# Patient Record
Sex: Female | Born: 1965 | Race: White | Hispanic: No | Marital: Married | Smoking: Never smoker
Health system: Southern US, Community
[De-identification: ages and names within clinical notes are randomized; demographics above are authoritative.]

## PROBLEM LIST (undated history)

## (undated) DIAGNOSIS — M069 Rheumatoid arthritis, unspecified: Secondary | ICD-10-CM

---

## 2005-05-15 ENCOUNTER — Other Ambulatory Visit: Admission: RE | Admit: 2005-05-15 | Discharge: 2005-05-15 | Payer: Self-pay | Admitting: Family Medicine

## 2007-02-14 ENCOUNTER — Ambulatory Visit (HOSPITAL_COMMUNITY): Admission: RE | Admit: 2007-02-14 | Discharge: 2007-02-14 | Payer: Self-pay | Admitting: Obstetrics and Gynecology

## 2007-03-13 ENCOUNTER — Inpatient Hospital Stay (HOSPITAL_COMMUNITY): Admission: AD | Admit: 2007-03-13 | Discharge: 2007-03-13 | Payer: Self-pay | Admitting: Obstetrics and Gynecology

## 2007-03-16 ENCOUNTER — Ambulatory Visit (HOSPITAL_COMMUNITY): Admission: RE | Admit: 2007-03-16 | Discharge: 2007-03-16 | Payer: Self-pay | Admitting: Obstetrics and Gynecology

## 2007-04-15 ENCOUNTER — Ambulatory Visit (HOSPITAL_COMMUNITY): Admission: RE | Admit: 2007-04-15 | Discharge: 2007-04-15 | Payer: Self-pay | Admitting: Obstetrics and Gynecology

## 2007-06-25 ENCOUNTER — Inpatient Hospital Stay (HOSPITAL_COMMUNITY): Admission: AD | Admit: 2007-06-25 | Discharge: 2007-06-27 | Payer: Self-pay | Admitting: Obstetrics and Gynecology

## 2007-06-25 ENCOUNTER — Encounter (INDEPENDENT_AMBULATORY_CARE_PROVIDER_SITE_OTHER): Payer: Self-pay | Admitting: Obstetrics and Gynecology

## 2009-10-27 IMAGING — CR DG ANKLE COMPLETE 3+V*R*
3 series · 3 of 3 positions shown · non-contrast
Comparison: none

CLINICAL DATA: Fall with lateral ankle pain. Patient is 6 months pregnant, and
the abdomen was shielded during today's radiographs.

RIGHT ANKLE - 3 VIEW

[view not recorded (1 of 3)]
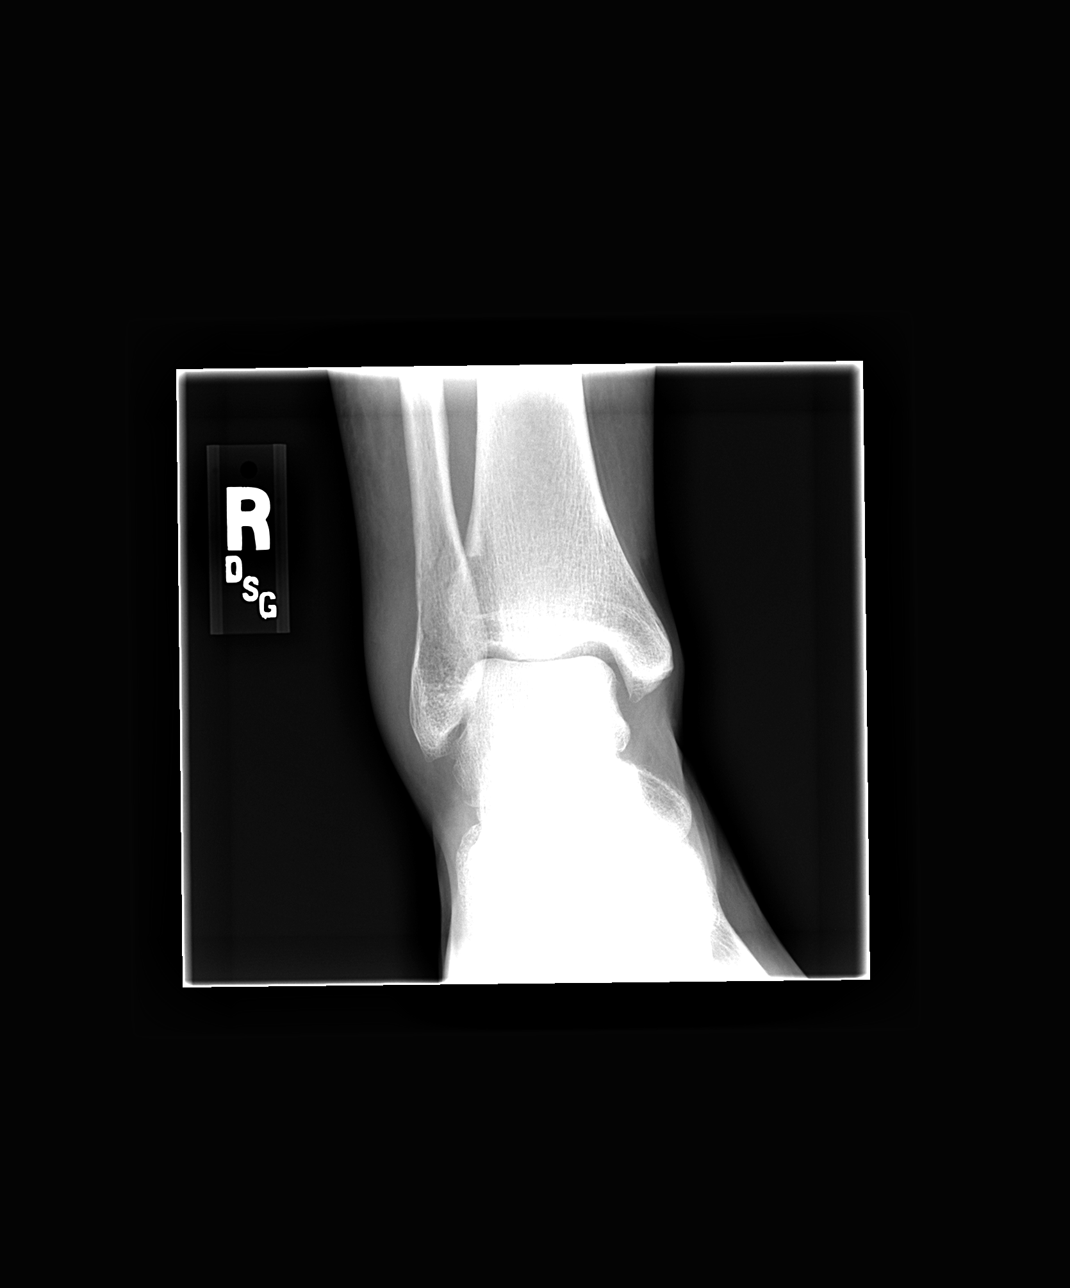

[view not recorded (2 of 3)]
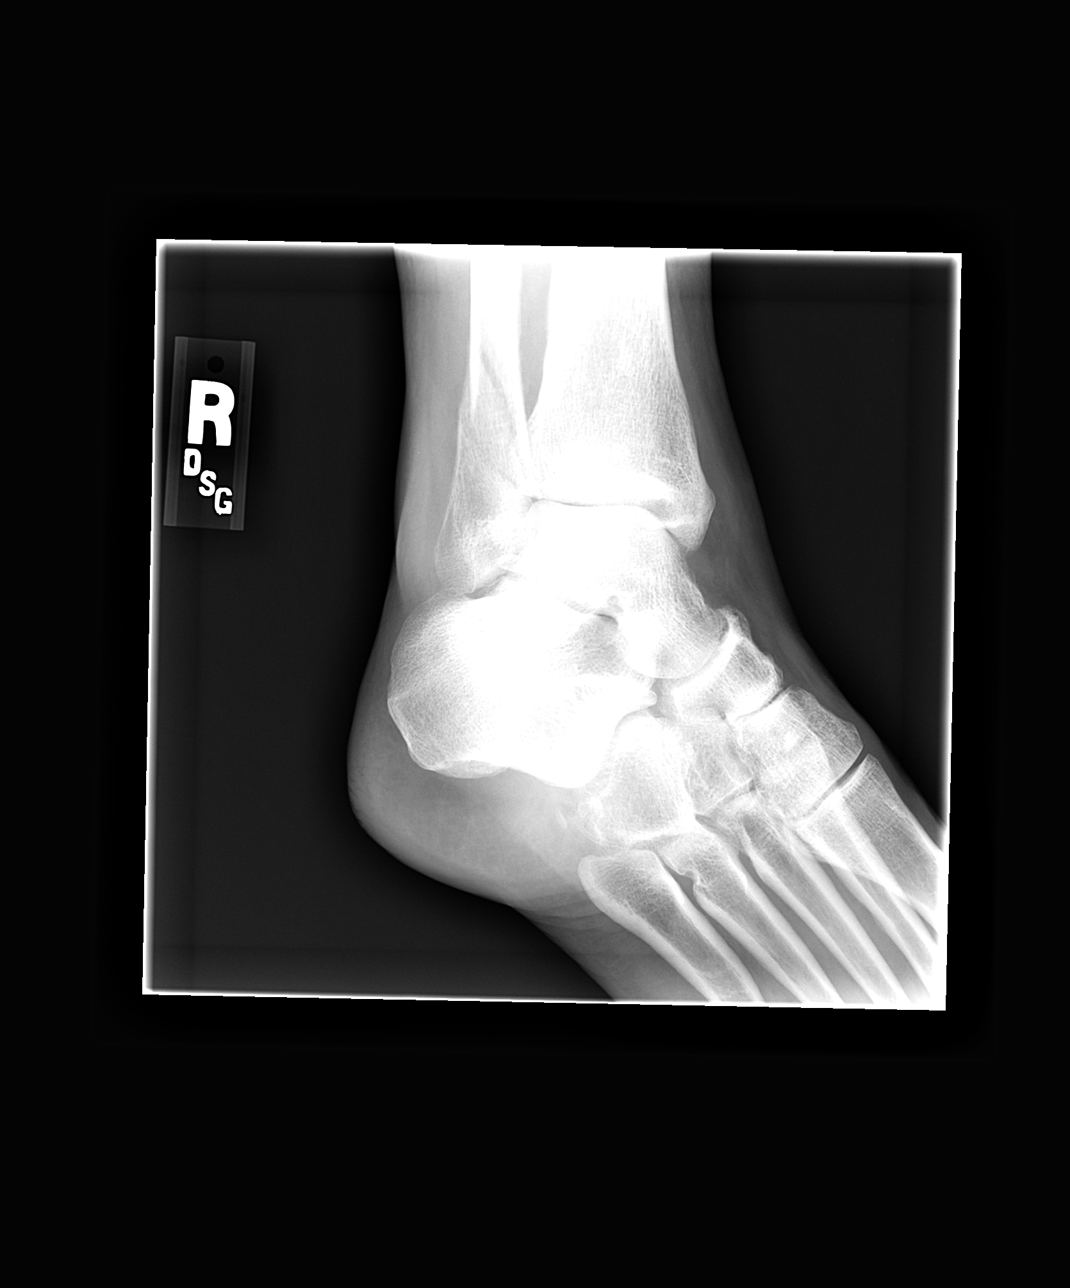

[view not recorded (3 of 3)]
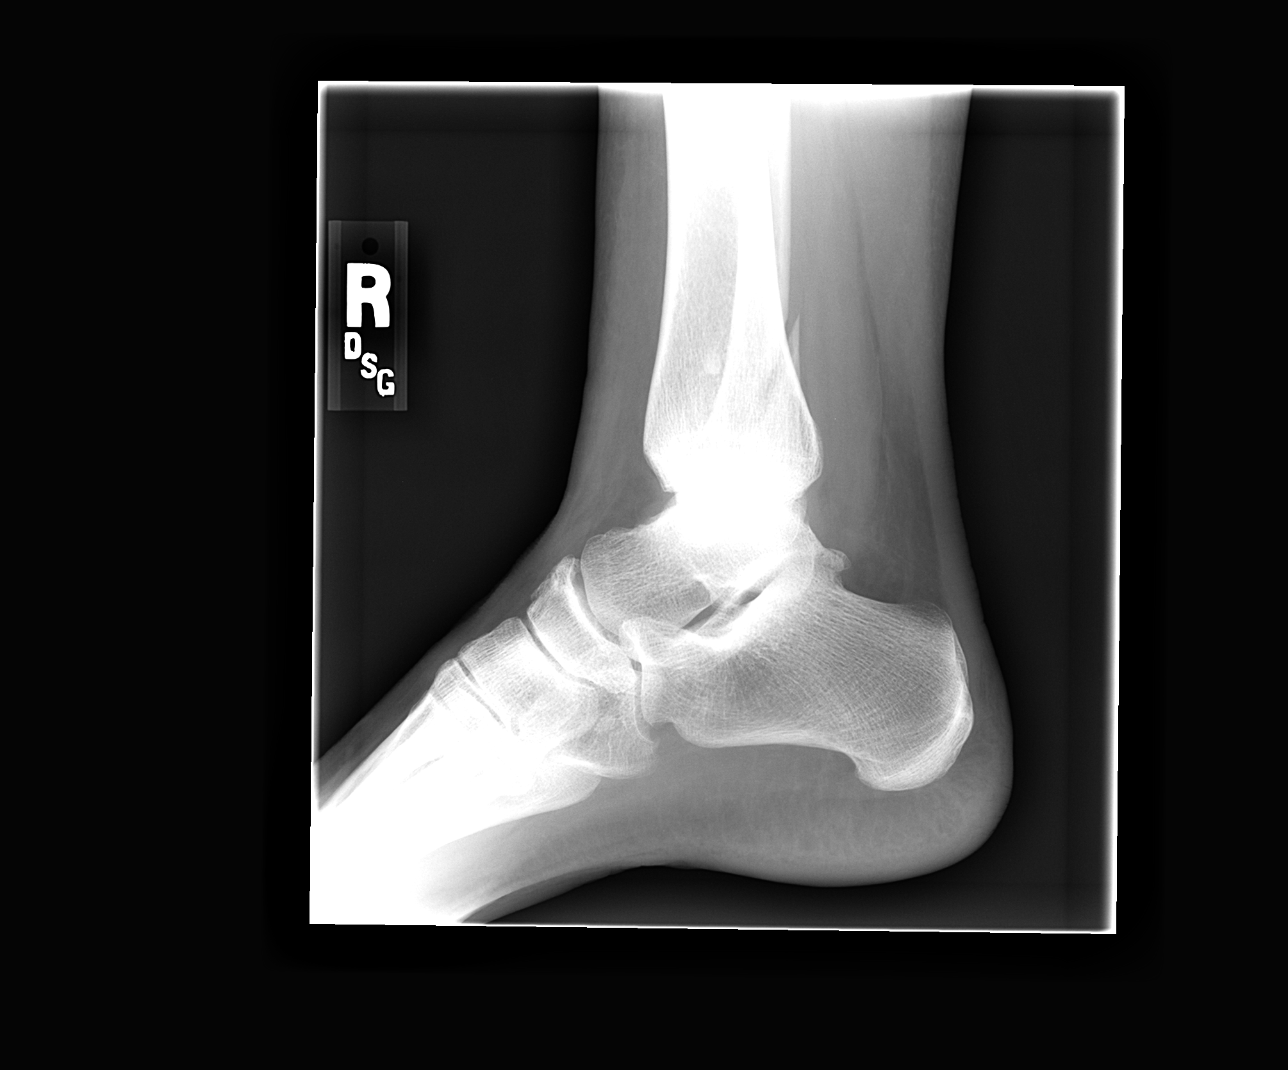

[3 of 3 positions shown; findings below may reference images not displayed]

FINDINGS: Oblique Weber B type fracture of the lateral malleolus is identified.
No medial or posterior malleolar fracture noted. The mortise space is preserved.

An os peroneus is noted. The calcaneus appears intact. An os trigonum is
present.

IMPRESSION

1. Oblique fracture of the lateral malleolus, without widening of the mortise.

## 2010-07-22 NOTE — Discharge Summary (Signed)
Maria Watson, Maria Watson           ACCOUNT NO.:  [id number]   MEDICAL RECORD NO.:  [id number]          PATIENT TYPE:  INP   LOCATION:  9107                          FACILITY:  [facility]   PHYSICIAN:  Malachi Pro. Ambrose Mantle, M.D. DATE OF BIRTH:  02/26/1966   DATE OF ADMISSION:  06/25/2007  DATE OF DISCHARGE:  06/27/2007                               DISCHARGE SUMMARY   A 45 year old white married female, para 1-0-0-1, gravida 2, EDC of  July 02, 2007, admitted for induction of labor.  Blood group and type,  A positive, negative antibody, nonreactive serology.  Rubella immune.  Hepatitis B surface antigen negative.  HIV negative.  GC and Chlamydia  negative.  First trimester screen negative.  The patient was offered  amnio, decline.  Group B strep negative.  The patient broke her ankle in  the second trimester at 24 weeks'.  Vaginal ultrasound on November 26, 2006, crown-rump length 2.25 cm 8 weeks 6 days, EDC on July 02, 2007.  The patient was seen by the [facility].  She  declined amnio.  Followup ultrasound showed normal AFI and normal  growth.   PAST MEDICAL HISTORY:  No known allergies, no operations, or no  illnesses.  The patient did use to backup prior to pregnancy.   FAMILY HISTORY:  Brother with diabetes.   OBSTETRIC HISTORY:  June 1988, the patient delivered a 6 pound 5-1/4  ounce female at 80 weeks' via forceps.   PHYSICAL EXAM ON ADMISSION:  VITAL SIGNS:  Normal.  HEART:  Normal.  LUNGS:  Normal.  ABDOMEN:  Soft, nontender.  Fundal height had been 38.5 cm.  On June 14, 2007, fetal heart tone returned to a normal.  Cervix was 2 cm, 60%,  vertex at -2.   ADMITTING IMPRESSIONS:  Intrauterine pregnancy at 63 weeks' at advanced  maternal age.  The patient was placed on Pitocin.  She reached 4 to 5 cm  and requested an epidural but she progressed rapidly to full diltation  and delivered spontaneously OA with second degree midline laceration of  a  living female infant 7 pounds and 10 ounces with Apgars of 8 at 1 and  9 at 5  minutes.  Placenta was intact.  The uterus was firm with the  walls never approximated.  Bleeding was not excessive, so I withheld  additional oxytocic agents.  Second degree midline laceration repaired  with 3-0 Vicryl.  Blood loss about 400 mL.  Postpartum, the patient did  quite well and was discharged on the second postpartum day.  Initial  hemoglobin was 13.4, hematocrit 38.0, white count 9300, platelet count  of 129, 000.  RPR was nonreactive.  Followup hemoglobin 13.4, hematocrit  38.1, platelet count 133, 000.   FINAL DIAGNOSES:  Intrauterine pregnancy at 39 weeks', delivered OA.  Operation, spontaneous delivery OA.  Repair of second degree midline  laceration.   FINAL CONDITION:  Improved.   INSTRUCTIONS:  Include our regular discharge instruction booklet.  The  patient declines analgesics at discharge, and she is advised to return  in 6 weeks to followup examination.  She  requests the Depo-Provera 150  mg IM prior to discharge.      Malachi Pro. Ambrose Mantle, M.D.  Electronically Signed     TFH/MEDQ  D:  06/27/2007  T:  06/28/2007  Job:  [id number]

## 2010-12-02 LAB — CBC
HCT: 38.1
Hemoglobin: 13.4
MCHC: 35
Platelets: 129 — ABNORMAL LOW
Platelets: 133 — ABNORMAL LOW
RBC: 3.94
RBC: 3.96
WBC: 12.3 — ABNORMAL HIGH
WBC: 9.3

## 2010-12-02 LAB — RPR: RPR Ser Ql: NONREACTIVE

## 2012-07-15 ENCOUNTER — Ambulatory Visit: Payer: BC Managed Care – PPO

## 2012-07-15 ENCOUNTER — Ambulatory Visit (INDEPENDENT_AMBULATORY_CARE_PROVIDER_SITE_OTHER): Payer: BC Managed Care – PPO | Admitting: Family Medicine

## 2012-07-15 VITALS — BP 124/82 | HR 96 | Temp 98.6°F | Resp 17 | Ht 67.0 in | Wt 214.0 lb

## 2012-07-15 DIAGNOSIS — IMO0002 Reserved for concepts with insufficient information to code with codable children: Secondary | ICD-10-CM

## 2012-07-15 DIAGNOSIS — M25511 Pain in right shoulder: Secondary | ICD-10-CM

## 2012-07-15 DIAGNOSIS — M25519 Pain in unspecified shoulder: Secondary | ICD-10-CM

## 2012-07-15 DIAGNOSIS — S46911A Strain of unspecified muscle, fascia and tendon at shoulder and upper arm level, right arm, initial encounter: Secondary | ICD-10-CM

## 2012-07-15 MED ORDER — HYDROCODONE-ACETAMINOPHEN 5-325 MG PO TABS: 1.0000 | ORAL_TABLET | Freq: Three times a day (TID) | ORAL | Status: DC | PRN

## 2012-07-15 MED ORDER — MELOXICAM 7.5 MG PO TABS: 7.5000 mg | ORAL_TABLET | Freq: Every day | ORAL | Status: DC

## 2012-07-15 NOTE — Progress Notes (Signed)
[facility] [address] [place] [address] [phone]  Date:  07/15/2012   Name:  Maria Watson   DOB:  11-09-1965   MRN:  [id number]  PCP:  No PCP Per Patient    Chief Complaint: Shoulder Pain   History of Present Illness:  Maria Watson is a 47 y.o. very pleasant female patient who presents with the following:  She is here with right shoulder pain since yesterday.  She is not aware of any particular injury.   She has tried OTC medications and heat but continues to have pain. She had not been aware of any twinges of pain, or of any other shoulder problems in the past several weeks.  She did "yank" her shoulder recently when she tried to open a door that was locked, but this did not seem like a big deal at the time.  She is having a hard time raising her right arm to dress, apply make-up, etc.    She has a 1 year old son and uses implanon for contraception.   She does not do any overhead activities, no new activities or weight lifting as of late.    She is otherwise generally healthy.    Her left shoulder is ok.  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   History reviewed. No pertinent past medical history.  History reviewed. No pertinent past surgical history.  History  Substance Use Topics  . Smoking status: Never Smoker   . Smokeless tobacco: Not on file  . Alcohol Use: No    History reviewed. No pertinent family history.  No Known Allergies  Medication list has been reviewed and updated.  No current outpatient prescriptions on file prior to visit.   No current facility-administered medications on file prior to visit.    Review of Systems:  As per HPI- otherwise negative.   Physical Examination: Filed Vitals:   07/15/12 1004  BP: 124/82  Pulse: 101  Temp: 99.4 F (37.4 C)  Resp: 17   Filed Vitals:   07/15/12 1004  Height: 5\' 7"  (1.702 m)   Weight: 214 lb (97.07 kg)   Body mass index is 33.51 kg/(m^2). Ideal Body Weight: Weight in (lb) to have BMI = 25: 159.3  GEN: WDWN, NAD, Non-toxic, A & O x 3, overweight HEENT: Atraumatic, Normocephalic. Neck supple. No masses, No LAD. Ears and Nose: No external deformity. CV: RRR, No M/G/R. No JVD. No thrill. No extra heart sounds. PULM: CTA B, no wheezes, crackles, rhonchi. No retractions. No resp. distress. No accessory muscle use. ABD: S, NT, ND, +BS. No rebound. No HSM. EXTR: No c/c/e NEURO Normal gait.  PSYCH: Normally interactive. Conversant. Not depressed or anxious appearing.  Calm demeanor.  Right shoulder: tender over the anterior right shoulder at the [facility] insertion.  She has about 120 degrees of passive flexion before pain limits flexion.  Able to abduct about 90 degrees.  Pain with internal and external rotation.  Grip, biceps and triceps strength testing normal.  Sensation is wnl bilaterlly, normal biceps DTR.   Neck ROM is normal, she is not tender over her trapezius or shoulder blade.    [facility] reading (PRIMARY) by  Dr. Patsy Lager. Right shoulder: no evidence of fracture, dislocation or ACJ separation   RIGHT SHOULDER - 2+ VIEW  Comparison: None.  Findings: Minimal acromioclavicular joint degenerative changes otherwise negative exam.  IMPRESSION: Minimal acromioclavicular joint degenerative changes otherwise negative exam.   Assessment and Plan: Right shoulder pain - Plan: DG Shoulder Right, meloxicam (MOBIC) 7.5 MG tablet, HYDROcodone-acetaminophen (NORCO/VICODIN) 5-325 MG per tablet  Acute right shoulder strain.  Treat with meloxicam and vicodin if needed for more severe pain. Close follow- up with referral to ortho if not better in the next few days.   Went over shoulder ROM exercises- arm swing and finger wall- climb   Signed Abbe Amsterdam, MD  Her orthopedist is Dr. Jodi Geralds at [facility] ortho

## 2012-07-15 NOTE — Patient Instructions (Addendum)
We are going to treat you for an acute right shoulder strain with rest (sling), ice, and anti- inflammatories (the mobic).  You may also use the hydrocodone if needed for more severe pain, but remember this can cause drowsiness so do not use it when you need to drive or use machinery.    If you are not a good bit better by Monday please give me a call. If you are not better I will arrange an appt for you with orthopedics to look at your shoulder further.    Even while you are using the sling, it is essential that we maintain your shoulder range of motion. Perform the exercises I showed you several times each day.

## 2019-06-21 ENCOUNTER — Ambulatory Visit
Admission: EM | Admit: 2019-06-21 | Discharge: 2019-06-21 | Disposition: A | Discharge status: Home / Self Care | Payer: 59

## 2019-06-21 ENCOUNTER — Other Ambulatory Visit: Payer: Self-pay

## 2019-06-21 DIAGNOSIS — M069 Rheumatoid arthritis, unspecified: Secondary | ICD-10-CM | POA: Diagnosis not present

## 2019-06-21 HISTORY — DX: Rheumatoid arthritis, unspecified: M06.9

## 2019-06-21 MED ORDER — ONDANSETRON 4 MG PO TBDP
4.0000 mg | ORAL_TABLET | Freq: Once | ORAL | Status: AC
  Administered 2019-06-21: 20:00:00 4 mg via ORAL

## 2019-06-21 MED ORDER — METHYLPREDNISOLONE SODIUM SUCC 125 MG IJ SOLR
125.0000 mg | Freq: Once | INTRAMUSCULAR | Status: AC
  Administered 2019-06-21: 20:00:00 125 mg via INTRAMUSCULAR

## 2019-06-21 MED ORDER — PREDNISONE 10 MG (21) PO TBPK: ORAL_TABLET | ORAL | 0 refills | Status: DC

## 2019-06-21 NOTE — ED Triage Notes (Signed)
Pain started at 0300 today in R hip radiating down leg to ankle.  Sharp, throbbing pain.  Feels like RA flare-up.  Her arthritis MD told her it was a flare-up and sent Rx for prednisone but pharmacy is out of stock and won't transfer.

## 2019-06-21 NOTE — Discharge Instructions (Signed)
Take medication as prescribed Follow-up with PCP for further evaluation Return or go to ED for worsening of symptoms 

## 2019-06-21 NOTE — ED Provider Notes (Signed)
[facility]    CSN: [id number] Arrival date & time: 06/21/19  1929      History   Chief Complaint Chief Complaint  Patient presents with  . Joint Pain    HPI Maria Watson is a 54 y.o. female.   With history of rheumatoid arthritis  presented to the urgent care with a complaint of joint pain that started today.  Denies any precipitating event.   PCP prescribed prednisone that was sent to [facility].  Currently [facility] is out of prednisone.  Localized pain to her right hip  and radiates to her right leg.  Described it as constant and achy and rated at 10 on a scale 1-10.  Has tried OTC medications without relief.  Her symptoms are made worse with ROM.  Reports similar symptoms in the past that resolved with prednisone treatment.  Denies chills, fever, nausea, vomiting, diarrhea, chest pain, chest tightness.  The history is provided by the patient. No language interpreter was used.    Past Medical History:  Diagnosis Date  . Rheumatoid arthritis (HCC)     There are no problems to display for this patient.   History reviewed. No pertinent surgical history.  OB History   No obstetric history on file.      Home Medications    Prior to Admission medications   Medication Sig Start Date End Date Taking? Authorizing Provider  meloxicam (MOBIC) 7.5 MG tablet Take 1 tablet (7.5 mg total) by mouth daily. May take 2 if needed 07/15/12  Yes Copland, Gwenlyn Found, MD  Tofacitinib Citrate (XELJANZ PO) Take by mouth.   Yes [provider]  HYDROcodone-acetaminophen (NORCO/VICODIN) 5-325 MG per tablet Take 1 tablet by mouth every 8 (eight) hours as needed for pain. 07/15/12   Copland, Gwenlyn Found, MD  predniSONE (STERAPRED UNI-PAK 21 TAB) 10 MG (21) TBPK tablet Take 6 tabs by mouth daily  for 2 days, then 5 tabs for 2 days, then 4 tabs for 2 days, then 3 tabs for 2 days, 2 tabs for 2 days, then 1 tab by mouth daily for 2 days 06/21/19   Durward Parcel, FNP     Family History Family History  Problem Relation Age of Onset  . Rheum arthritis Mother   . Stroke Father     Social History Social History   Tobacco Use  . Smoking status: Never Smoker  . Smokeless tobacco: Never Used  Substance Use Topics  . Alcohol use: No  . Drug use: No     Allergies   Patient has no known allergies.   Review of Systems Review of Systems  Constitutional: Negative.   Respiratory: Negative.   Cardiovascular: Negative.   Musculoskeletal: Positive for arthralgias and myalgias.  Neurological: Negative.   All other systems reviewed and are negative.    Physical Exam Triage Vital Signs ED Triage Vitals  Enc Vitals Group     BP      Pulse      Resp      Temp      Temp src      SpO2      Weight      Height      Head Circumference      Peak Flow      Pain Score      Pain Loc      Pain Edu?      Excl. in GC?    No data found.  Updated Vital Signs BP Marland Kitchen)  149/86   Pulse 95   Temp (!) 97.4 F (36.3 C) (Oral)   Resp (!) 22   SpO2 96%   Visual Acuity Right Eye Distance:   Left Eye Distance:   Bilateral Distance:    Right Eye Near:   Left Eye Near:    Bilateral Near:     Physical Exam Vitals and nursing note reviewed.  Constitutional:      General: She is not in acute distress.    Appearance: Normal appearance. She is normal weight. She is not ill-appearing, toxic-appearing or diaphoretic.  Cardiovascular:     Rate and Rhythm: Normal rate and regular rhythm.     Pulses: Normal pulses.     Heart sounds: Normal heart sounds. No murmur. No friction rub. No gallop.   Pulmonary:     Effort: Pulmonary effort is normal. Tachypnea present. No respiratory distress.     Breath sounds: Normal breath sounds. No stridor. No wheezing, rhonchi or rales.  Chest:     Chest wall: No tenderness.  Musculoskeletal:        General: Tenderness present. No swelling, deformity or signs of injury.     Right lower leg: No edema.     Left lower  leg: No edema.  Skin:    General: Skin is warm.     Coloration: Skin is not pale.     Findings: No bruising or rash.  Neurological:     General: No focal deficit present.     Mental Status: She is alert and oriented to person, place, and time.     Cranial Nerves: Cranial nerves are intact.     Sensory: No sensory deficit.     Motor: Motor function is intact.     Coordination: Coordination is intact.     Gait: Gait abnormal.      UC Treatments / Results  Labs (all labs ordered are listed, but only abnormal results are displayed) Labs Reviewed - No data to display  EKG   Radiology No results found.  Procedures Procedures (including critical care time)  Medications Ordered in UC Medications  methylPREDNISolone sodium succinate (SOLU-MEDROL) 125 mg/2 mL injection 125 mg (125 mg Intramuscular Given 06/21/19 1954)  ondansetron (ZOFRAN-ODT) disintegrating tablet 4 mg (4 mg Oral Given 06/21/19 2000)    Initial Impression / Assessment and Plan / UC Course  I have reviewed the triage vital signs and the nursing notes.  Pertinent labs & imaging results that were available during my care of the patient were reviewed by me and considered in my medical decision making (see chart for details).    Patient is stable at discharge.  Was made aware that steroid and tofacitinib combination may decrease the effect of her immune system.  Solu-Medrol IM was given in office.  Prednisone taper was prescribed.  Was advised to follow-up with PCP.  Return or go to ED for worsening of symptoms.  Final Clinical Impressions(s) / UC Diagnoses   Final diagnoses:  Rheumatoid arthritis involving right hip, unspecified whether rheumatoid factor present [facility])     Discharge Instructions     Take medication as prescribed Follow-up with PCP for further evaluation Return or go to ED for worsening of symptoms    ED Prescriptions    Medication Sig Dispense Auth. Provider   predniSONE (STERAPRED  UNI-PAK 21 TAB) 10 MG (21) TBPK tablet Take 6 tabs by mouth daily  for 2 days, then 5 tabs for 2 days, then 4 tabs for 2 days, then  3 tabs for 2 days, 2 tabs for 2 days, then 1 tab by mouth daily for 2 days 42 tablet Jesslyn Viglione, Darrelyn Hillock, FNP     I have reviewed the PDMP during this encounter.   Emerson Monte, FNP 06/22/19 [phone]

## 2019-08-28 ENCOUNTER — Encounter: Payer: Self-pay | Admitting: Emergency Medicine

## 2019-08-28 ENCOUNTER — Ambulatory Visit
Admission: EM | Admit: 2019-08-28 | Discharge: 2019-08-28 | Disposition: A | Discharge status: Home / Self Care | Payer: BC Managed Care – PPO | Attending: Emergency Medicine | Admitting: Emergency Medicine

## 2019-08-28 ENCOUNTER — Other Ambulatory Visit: Payer: Self-pay

## 2019-08-28 DIAGNOSIS — H66002 Acute suppurative otitis media without spontaneous rupture of ear drum, left ear: Secondary | ICD-10-CM | POA: Diagnosis not present

## 2019-08-28 DIAGNOSIS — R0981 Nasal congestion: Secondary | ICD-10-CM

## 2019-08-28 MED ORDER — AMOXICILLIN 500 MG PO CAPS: 500.0000 mg | ORAL_CAPSULE | Freq: Two times a day (BID) | ORAL | 0 refills | Status: AC

## 2019-08-28 NOTE — ED Provider Notes (Signed)
[facility]   [id number] 08/28/19 Arrival Time: 7741   CC: Ear pain  SUBJECTIVE: History from: patient.  Maria Watson is a 54 y.o. female who presents with sinus congestion, and mild cough x 1 week, and LT ear pain x 1 day.  Denies sick exposure to COVID, flu or strep.  Has tried OTC medications without relief.  Denies aggravating factors.  Reports previous symptoms in the past.   Denies fever, chills, fatigue,  SOB, wheezing, chest pain, nausea, vomiting, changes in bowel or bladder habits.    ROS: As per HPI.  All other pertinent ROS negative.     Past Medical History:  Diagnosis Date   Rheumatoid arthritis (HCC)    History reviewed. No pertinent surgical history. No Known Allergies No current facility-administered medications on file prior to encounter.   Current Outpatient Medications on File Prior to Encounter  Medication Sig Dispense Refill   Tofacitinib Citrate (XELJANZ PO) Take by mouth.     Social History   Socioeconomic History   Marital status: Married    Spouse name: Not on file   Number of children: Not on file   Years of education: Not on file   Highest education level: Not on file  Occupational History   Not on file  Tobacco Use   Smoking status: Never Smoker   Smokeless tobacco: Never Used  Vaping Use   Vaping Use: Never used  Substance and Sexual Activity   Alcohol use: No   Drug use: No   Sexual activity: Never    Birth control/protection: None  Other Topics Concern   Not on file  Social History Narrative   Not on file   Social Determinants of Health   Financial Resource Strain:    Difficulty of Paying Living Expenses:   Food Insecurity:    Worried About Programme researcher, broadcasting/film/video in the Last Year:    Barista in the Last Year:   Transportation Needs:    Freight forwarder (Medical):    Lack of Transportation (Non-Medical):   Physical Activity:    Days of Exercise per Week:    Minutes of  Exercise per Session:   Stress:    Feeling of Stress :   Social Connections:    Frequency of Communication with Friends and Family:    Frequency of Social Gatherings with Friends and Family:    Attends Religious Services:    Active Member of Clubs or Organizations:    Attends Engineer, structural:    Marital Status:   Intimate Partner Violence:    Fear of Current or Ex-Partner:    Emotionally Abused:    Physically Abused:    Sexually Abused:    Family History  Problem Relation Age of Onset   Rheum arthritis Mother    Stroke Father     OBJECTIVE:  Vitals:   08/28/19 0834  BP: 137/82  Pulse: 80  Resp: 17  Temp: 98.1 F (36.7 C)  TempSrc: Oral  SpO2: 96%     General appearance: alert; well-appearing, nontoxic; speaking in full sentences and tolerating own secretions HEENT: NCAT; Ears: RT EAC clear, RT TM pearly gray, LT EAC erythematous, and TM mildly erythematou; Eyes: PERRL.  EOM grossly intact.  Nose: nares patent without rhinorrhea, Throat: oropharynx clear, tonsils non erythematous or enlarged, uvula midline  Neck: supple without LAD Lungs: unlabored respirations, symmetrical air entry; cough: absent; no respiratory distress; CTAB Heart: regular rate and rhythm.   Skin:  warm and dry Psychological: alert and cooperative; normal mood and affect   ASSESSMENT & PLAN:  1. Non-recurrent acute suppurative otitis media of left ear without spontaneous rupture of tympanic membrane   2. Sinus congestion     Meds ordered this encounter  Medications   amoxicillin (AMOXIL) 500 MG capsule    Sig: Take 1 capsule (500 mg total) by mouth 2 (two) times daily for 10 days.    Dispense:  20 capsule    Refill:  0    Order Specific Question:   Supervising Provider    Answer:   Raylene Everts [[id number]]   Get plenty of rest and push fluids Use OTC zyrtec for nasal congestion, runny nose, and/or sore throat Use OTC flonase for nasal congestion and runny  nose Amoxicillin prescribed for LT ear infection Use OTC medications like ibuprofen or tylenol as needed fever or pain Return or go to the ED if you have any new or worsening symptoms such as fever, cough, shortness of breath, chest tightness, chest pain, turning blue, changes in mental status, redness, swelling, worsening ear pain, etc...   Reviewed expectations re: course of current medical issues. Questions answered. Outlined signs and symptoms indicating need for more acute intervention. Patient verbalized understanding. After Visit Summary given.         Stacey Drain [place], PA-C 08/28/19 [phone]

## 2019-08-28 NOTE — Discharge Instructions (Signed)
Get plenty of rest and push fluids Use OTC zyrtec for nasal congestion, runny nose, and/or sore throat Use OTC flonase for nasal congestion and runny nose Amoxicillin prescribed for LT ear infection Use OTC medications like ibuprofen or tylenol as needed fever or pain Return or go to the ED if you have any new or worsening symptoms such as fever, cough, shortness of breath, chest tightness, chest pain, turning blue, changes in mental status, redness, swelling, worsening ear pain, etc..Marland Kitchen

## 2019-08-28 NOTE — ED Triage Notes (Signed)
Nasal congestion x 1 week, lt ear pain started last night, tried otc meds and warm compresses with no relief.

## 2019-09-01 ENCOUNTER — Telehealth: Payer: Self-pay

## 2019-09-01 MED ORDER — NEOMYCIN-POLYMYXIN-HC 3.5-10000-1 OT SUSP: 4.0000 [drp] | Freq: Three times a day (TID) | OTIC | 0 refills | Status: AC

## 2020-03-07 ENCOUNTER — Ambulatory Visit
Admission: EM | Admit: 2020-03-07 | Discharge: 2020-03-07 | Disposition: A | Discharge status: Home / Self Care | Payer: BC Managed Care – PPO | Attending: Family Medicine | Admitting: Family Medicine

## 2020-03-07 ENCOUNTER — Other Ambulatory Visit: Payer: Self-pay

## 2020-03-07 DIAGNOSIS — R059 Cough, unspecified: Secondary | ICD-10-CM

## 2020-03-07 DIAGNOSIS — R0981 Nasal congestion: Secondary | ICD-10-CM

## 2020-03-07 DIAGNOSIS — B349 Viral infection, unspecified: Secondary | ICD-10-CM

## 2020-03-07 NOTE — Discharge Instructions (Addendum)
Your COVID and Flu tests are pending.  You should self quarantine until the test results are back.    Take Tylenol or ibuprofen as needed for fever or discomfort.  Rest and keep yourself hydrated.    Follow-up with your primary care provider if your symptoms are not improving.     

## 2020-03-07 NOTE — ED Provider Notes (Signed)
[facility]   [id number] 03/07/20 Arrival Time: 1028   CC: COVID symptoms  SUBJECTIVE: History from: patient.  Maria Watson is a 54 y.o. female who presents with abrupt onset of nasal congestion, PND, and persistent dry cough for the last 2 days. Denies sick exposure to COVID, flu or strep. Denies recent travel. Has negative history of Covid. Has completed Covid vaccines. Has not taken OTC medications for this. There are no aggravating or alleviating factors. Denies previous symptoms in the past. Denies fever, chills, fatigue, sinus pain, sore throat, SOB, wheezing, chest pain, nausea, changes in bowel or bladder habits.    ROS: As per HPI.  All other pertinent ROS negative.     Past Medical History:  Diagnosis Date  . Rheumatoid arthritis (HCC)    History reviewed. No pertinent surgical history. No Known Allergies No current facility-administered medications on file prior to encounter.   Current Outpatient Medications on File Prior to Encounter  Medication Sig Dispense Refill  . Tofacitinib Citrate (XELJANZ PO) Take by mouth.     Social History   Socioeconomic History  . Marital status: Married    Spouse name: Not on file  . Number of children: Not on file  . Years of education: Not on file  . Highest education level: Not on file  Occupational History  . Not on file  Tobacco Use  . Smoking status: Never Smoker  . Smokeless tobacco: Never Used  Vaping Use  . Vaping Use: Never used  Substance and Sexual Activity  . Alcohol use: No  . Drug use: No  . Sexual activity: Never    Birth control/protection: None  Other Topics Concern  . Not on file  Social History Narrative  . Not on file   Social Determinants of Health   Financial Resource Strain: Not on file  Food Insecurity: Not on file  Transportation Needs: Not on file  Physical Activity: Not on file  Stress: Not on file  Social Connections: Not on file  Intimate Partner Violence: Not on  file   Family History  Problem Relation Age of Onset  . Rheum arthritis Mother   . Stroke Father     OBJECTIVE:  Vitals:   03/07/20 1044 03/07/20 1045  BP:  128/84  Pulse:  84  Temp:  98.8 F (37.1 C)  TempSrc:  Oral  SpO2:  95%  Weight: 225 lb (102.1 kg)   Height: 5\' 6"  (1.676 m)      General appearance: alert; appears fatigued, but nontoxic; speaking in full sentences and tolerating own secretions HEENT: NCAT; Ears: EACs clear, TMs pearly gray; Eyes: PERRL.  EOM grossly intact. Sinuses: nontender; Nose: nares patent without rhinorrhea, Throat: oropharynx erythematous, cobblestoning presetn, tonsils non erythematous or enlarged, uvula midline  Neck: supple without LAD Lungs: unlabored respirations, symmetrical air entry; cough: absent; no respiratory distress; CTAB Heart: regular rate and rhythm.  Radial pulses 2+ symmetrical bilaterally Skin: warm and dry Psychological: alert and cooperative; normal mood and affect  LABS:  No results found for this or any previous visit (from the past 24 hour(s)).   ASSESSMENT & PLAN:  1. Viral illness   2. Cough   3. Nasal congestion    Continue supportive care at home COVID and flu testing ordered.  It will take between 1-2 days for test results.  Someone will contact you regarding abnormal results.   Work note provided Patient should remain in quarantine until they have received Covid results.  If negative  you may resume normal activities (go back to work/school) while practicing hand hygiene, social distance, and mask wearing.  If positive, patient should remain in quarantine for 10 days from symptom onset AND greater than 72 hours after symptoms resolution (absence of fever without the use of fever-reducing medication and improvement in respiratory symptoms), whichever is longer Get plenty of rest and push fluids Use OTC zyrtec for nasal congestion, runny nose, and/or sore throat Use OTC flonase for nasal congestion and runny  nose Use medications daily for symptom relief Use OTC medications like ibuprofen or tylenol as needed fever or pain Call or go to the ED if you have any new or worsening symptoms such as fever, worsening cough, shortness of breath, chest tightness, chest pain, turning blue, changes in mental status.  Reviewed expectations re: course of current medical issues. Questions answered. Outlined signs and symptoms indicating need for more acute intervention. Patient verbalized understanding. After Visit Summary given.         Moshe Cipro, NP 03/07/20 1124

## 2020-03-07 NOTE — ED Triage Notes (Signed)
Pt states that she has some nasal congestion, a cough, and head congestion. x2 days. Pt states that she is vaccinated.

## 2020-03-10 LAB — COVID-19, FLU A+B NAA
Influenza A, NAA: NOT DETECTED
Influenza B, NAA: NOT DETECTED
SARS-CoV-2, NAA: NOT DETECTED

## 2022-10-27 ENCOUNTER — Other Ambulatory Visit: Payer: Self-pay | Admitting: Rheumatology

## 2022-10-27 DIAGNOSIS — R7989 Other specified abnormal findings of blood chemistry: Secondary | ICD-10-CM

## 2022-11-03 ENCOUNTER — Ambulatory Visit
Admission: RE | Admit: 2022-11-03 | Discharge: 2022-11-03 | Disposition: A | Discharge status: Home / Self Care | Payer: BC Managed Care – PPO | Source: Ambulatory Visit | Attending: Rheumatology | Admitting: Rheumatology

## 2022-11-03 DIAGNOSIS — R7989 Other specified abnormal findings of blood chemistry: Secondary | ICD-10-CM

## 2023-07-16 ENCOUNTER — Encounter (HOSPITAL_COMMUNITY): Payer: Self-pay

## 2023-07-19 ENCOUNTER — Ambulatory Visit: Admitting: Student in an Organized Health Care Education/Training Program

## 2023-07-23 ENCOUNTER — Ambulatory Visit: Admitting: Student in an Organized Health Care Education/Training Program

## 2023-07-23 ENCOUNTER — Encounter: Payer: Self-pay | Admitting: Student in an Organized Health Care Education/Training Program

## 2023-07-23 VITALS — BP 136/84 | HR 70 | Ht 65.5 in | Wt 228.0 lb

## 2023-07-23 DIAGNOSIS — M069 Rheumatoid arthritis, unspecified: Secondary | ICD-10-CM | POA: Insufficient documentation

## 2023-07-23 DIAGNOSIS — E66812 Obesity, class 2: Secondary | ICD-10-CM | POA: Diagnosis not present

## 2023-07-23 DIAGNOSIS — Z7985 Long-term (current) use of injectable non-insulin antidiabetic drugs: Secondary | ICD-10-CM

## 2023-07-23 DIAGNOSIS — M059 Rheumatoid arthritis with rheumatoid factor, unspecified: Secondary | ICD-10-CM | POA: Diagnosis not present

## 2023-07-23 DIAGNOSIS — E785 Hyperlipidemia, unspecified: Secondary | ICD-10-CM | POA: Diagnosis not present

## 2023-07-23 DIAGNOSIS — E119 Type 2 diabetes mellitus without complications: Secondary | ICD-10-CM

## 2023-07-23 DIAGNOSIS — R748 Abnormal levels of other serum enzymes: Secondary | ICD-10-CM | POA: Insufficient documentation

## 2023-07-23 DIAGNOSIS — G4733 Obstructive sleep apnea (adult) (pediatric): Secondary | ICD-10-CM

## 2023-07-23 DIAGNOSIS — Z114 Encounter for screening for human immunodeficiency virus [HIV]: Secondary | ICD-10-CM

## 2023-07-23 LAB — LIPID PANEL
Cholesterol: 197 mg/dL (ref 0–200)
HDL: 43.7 mg/dL (ref >39.00)
LDL Cholesterol: 132 mg/dL — ABNORMAL HIGH (ref 0–99)
NonHDL: 153.26
Total CHOL/HDL Ratio: 5
Triglycerides: 107 mg/dL (ref 0.0–149.0)
VLDL: 21.4 mg/dL (ref 0.0–40.0)

## 2023-07-23 LAB — HEMOGLOBIN A1C: Hgb A1c MFr Bld: 6.8 % — ABNORMAL HIGH (ref 4.6–6.5)

## 2023-07-23 LAB — CBC
HCT: 41.5 % (ref 36.0–46.0)
Hemoglobin: 14 g/dL (ref 12.0–15.0)
MCHC: 33.7 g/dL (ref 30.0–36.0)
MCV: 93.2 fl (ref 78.0–100.0)
Platelets: 185 10*3/uL (ref 150.0–400.0)
RBC: 4.45 Mil/uL (ref 3.87–5.11)
RDW: 13.2 % (ref 11.5–15.5)
WBC: 4.1 10*3/uL (ref 4.0–10.5)

## 2023-07-23 NOTE — Assessment & Plan Note (Signed)
 Recent elevation in ALT and AST, normal bilirubin, consistent with some type of hepatocellular inflammation of the liver.  Exam is reassuring, no signs of cirrhosis on exam.  No recent illness to suggest acute liver injury.  Differential diagnosis I think includes metabolic associated liver disease versus drug-induced liver injury.  She uses tofacitinib for management of rheumatoid arthritis and this medication is known to cause elevation in liver enzymes.  Will check labs today to rule out other rare causes of chronic liver disease like hemochromatosis, Wilsons, viral hepatitis, and autoimmune.  It is unclear if she has fibrosis of the liver at this point.  Fib 4 score is indeterminate at 2.1.  She had a ultrasound of the liver 1 year ago with normal spleen size, increased echogenicity consistent with steatosis.  Will order a elastography to better estimate risk of fibrosis.  For now I think it is okay to continue tofacitinib unless the liver enzymes rise to above 3 times the upper limit of normal.

## 2023-07-23 NOTE — Progress Notes (Addendum)
 New Patient Office Visit  Subjective    Patient ID: Maria Watson, female    DOB: [birth year]  Age: 58 y.o. MRN: [id number]  CC:   Chief Complaint  Patient presents with   Establish Care    Patient states she has rheumatoid arthritis and was diagnosed last year for fatty liver disease. Labs taken on may 6th from Dr.Hawkes.     HPI  Maria Watson presents to establish care  58 year old person living with rheumatoid arthritis here for evaluation of elevated liver enzymes.  Patient has had rheumatoid arthritis for about 10 years.  This is well-managed with Dr. Meredith Stalls at [facility] rheumatology.  For the last 4 years she has been using oral tofacitinib with good benefit in her rheumatoid arthritis.  As part of safety monitoring she has routine liver enzymes checked, and earlier in May it was noted that her ALT and AST have significantly increased.  She had an ultrasound done last year, I suspect for a small increase, which showed steatosis.  Overall the patient feels well.  She has struggled with weight for much of her life.  She has worked on various weight loss programs and structured dieting but without much success.  She lives an active lifestyle.  No recent illnesses.  Denies any use of over-the-counter medications like Tylenol .  No use of other vitamins.  No history of viral hepatitis.  No recent fevers or chills, nausea or vomiting.  No hospitalizations or surgeries.  No changes in her medications, does not take any other prescribed medications besides the tofacitinib.  She lives in [place] with her husband, 74 year old daughter, 5 cats and 2 dogs.  Denies any alcohol use.  Her mother develops liver failure in her 36s in the setting of an acute illness, likely sepsis from a foot wound infection.  She ultimately died from this illness.  No liver disease in her siblings or father.  She denies any history of arthritis, no history of carpal tunnel syndrome.  She works as a Public house manager for diagnostic radiology.  No tobacco use or other drug use.   Outpatient Encounter Medications as of 07/23/2023  Medication Sig   Semaglutide,0.25 or 0.5MG /DOS, (OZEMPIC, 0.25 OR 0.5 MG/DOSE,) 2 MG/3ML SOPN Inject 0.25 mg into the skin once a week.   XELJANZ 5 MG TABS Take 1 tablet by mouth 2 (two) times daily.   [DISCONTINUED] Tofacitinib Citrate (XELJANZ PO) Take by mouth.   No facility-administered encounter medications on file as of 07/23/2023.    Past Medical History:  Diagnosis Date   Rheumatoid arthritis (HCC)     History reviewed. No pertinent surgical history.  Family History  Problem Relation Age of Onset   Rheum arthritis Mother    Stroke Father         Objective    BP 136/84   Pulse 70   Ht 5' 5.5" (1.664 m)   Wt 228 lb (103.4 kg)   SpO2 98%   BMI 37.36 kg/m   Physical Exam  Gen: Well-appearing woman Eyes: Normal Ears: Normal bilateral tympanic membranes Neck: Increased circumference at 17 inches, normal thyroid, no nodules or adenopathy Heart: Regular, no murmur Lungs: Unlabored, clear throughout Abd: Soft, nontender, no organomegaly, no spider angioma, no striae, no telangiectasias Ext: warm, no edema, normal joints, no synovitis  Neuro: Alert, conversational, full strength upper and lower extremities, normal get up and go, normal gait, normal balance Psych: Appropriate mood and affect, little tearful at times, not  anxious or depressed appearing, speech is organized, thoughts are linear     Assessment & Plan:   Problem List Items Addressed This Visit       High   Diabetes mellitus without complication (HCC) (Chronic)   New diagnosis.  History of prediabetes.  A1c now 6.8%.  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  Because of that, I am going to start treatment with a GLP-1 agonist as this has better control of metabolic liver disease than metformin would.  Will start with Ozempic.  Patient has no  contraindications to GLP-1 agonists.      Relevant Medications   Semaglutide,0.25 or 0.5MG /DOS, (OZEMPIC, 0.25 OR 0.5 MG/DOSE,) 2 MG/3ML SOPN   Elevated liver enzymes - Primary (Chronic)   Recent elevation in ALT and AST, normal bilirubin, consistent with some type of hepatocellular inflammation of the liver.  Exam is reassuring, no signs of cirrhosis on exam.  No recent illness to suggest acute liver injury.  Differential diagnosis I think includes metabolic associated liver disease versus drug-induced liver injury.  She uses tofacitinib for management of rheumatoid arthritis and this medication is known to cause elevation in liver enzymes.  Will check labs today to rule out other rare causes of chronic liver disease like hemochromatosis, Wilsons, viral hepatitis, and autoimmune.  It is unclear if she has fibrosis of the liver at this point.  Fib 4 score is indeterminate at 2.1.  She had a ultrasound of the liver 1 year ago with normal spleen size, increased echogenicity consistent with steatosis.  Will order a elastography to better estimate risk of fibrosis.  For now I think it is okay to continue tofacitinib unless the liver enzymes rise to above 3 times the upper limit of normal.      Relevant Orders   CBC (Completed)   Hepatitis C antibody (Completed)   Iron, TIBC and Ferritin Panel (Completed)   Hepatitis B surface antibody,qualitative (Completed)   Hepatitis B Core Antibody, total (Completed)   Hepatitis B surface antigen (Completed)   US  ELASTOGRAPHY LIVER (Completed)   Antinuclear Antib (ANA) (Completed)   Anti-smooth muscle antibody, IgG (Completed)   Ceruloplasmin (Completed)   Hepatitis A Ab, Total (Completed)   Rheumatoid arthritis (HCC) (Chronic)   Chronic and stable.  Diagnosed about 10 years ago.  Has used various Biologics but has seen the most success with oral tofacitinib over the last 4 years.  This is managed by [facility] rheumatology, Dr. Meredith Stalls.      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  Weight today is 228 pounds with a BMI of 37.3.  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  We talked about healthy nutrition for weight loss as well as increased exercise.  She has made numerous lifestyle interventions but has not been successful in losing weight.  She has tried structured weight loss regimens with Optavia diet program, but this was not successful.  Will check A1c today to evaluate for diabetes.  She is at risk for this.  We talked about GLP-1 agonist or medication assisted treatment.  I think this would be medically necessary to help reduce her risk of worsening liver disease and setting of hepatic steatosis.      Relevant Orders   Hemoglobin A1c (Completed)   TSH (Completed)   Hyperlipidemia (  Chronic)   History of hyperlipidemia.  Will check lipids today.  Last ASCVD risk around 5%.  Probably underestimates her risk given chronic inflammatory state from rheumatoid arthritis.  I think if the liver enzymes stabilize, will recommend initiation of statin for primary prevention of ischemic disease.      Relevant Orders   Lipid panel (Completed)     Low   OSA (obstructive sleep apnea) (Chronic)   Patient has high risk for obstructive sleep apnea based on exam.  She has increased neck circumference, crowded posterior oropharynx, and history of loud snoring.  Otherwise feels like she has restful sleep.  Has not done a sleep study before, may be interested in future.      Other Visit Diagnoses       Screening for HIV (human immunodeficiency virus)       Relevant Orders   HIV Antibody (routine testing w rflx) (Completed)       Return in about 2 months (around 09/22/2023).   Ether Hercules, MD

## 2023-07-23 NOTE — Assessment & Plan Note (Signed)
 Patient has high risk for obstructive sleep apnea based on exam.  She has increased neck circumference, crowded posterior oropharynx, and history of loud snoring.  Otherwise feels like she has restful sleep.  Has not done a sleep study before, may be interested in future.

## 2023-07-23 NOTE — Assessment & Plan Note (Signed)
 History of hyperlipidemia.  Will check lipids today.  Last ASCVD risk around 5%.  Probably underestimates her risk given chronic inflammatory state from rheumatoid arthritis.  I think if the liver enzymes stabilize, will recommend initiation of statin for primary prevention of ischemic disease.

## 2023-07-23 NOTE — Assessment & Plan Note (Signed)
 Chronic issue.  Weight today is 228 pounds with a BMI of 37.3.  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  We talked about healthy nutrition for weight loss as well as increased exercise.  She has made numerous lifestyle interventions but has not been successful in losing weight.  She has tried structured weight loss regimens with Optavia diet program, but this was not successful.  Will check A1c today to evaluate for diabetes.  She is at risk for this.  We talked about GLP-1 agonist or medication assisted treatment.  I think this would be medically necessary to help reduce her risk of worsening liver disease and setting of hepatic steatosis.

## 2023-07-23 NOTE — Progress Notes (Signed)
 Completed.

## 2023-07-23 NOTE — Assessment & Plan Note (Signed)
 Chronic and stable.  Diagnosed about 10 years ago.  Has used various Biologics but has seen the most success with oral tofacitinib over the last 4 years.  This is managed by [facility] rheumatology, Dr. Meredith Stalls.

## 2023-07-27 ENCOUNTER — Ambulatory Visit: Payer: Self-pay | Admitting: Student in an Organized Health Care Education/Training Program

## 2023-07-27 LAB — TSH: TSH: 3.37 u[IU]/mL (ref 0.35–5.50)

## 2023-07-28 ENCOUNTER — Ambulatory Visit
Admission: RE | Admit: 2023-07-28 | Discharge: 2023-07-28 | Disposition: A | Discharge status: Home / Self Care | Source: Ambulatory Visit | Attending: Student in an Organized Health Care Education/Training Program | Admitting: Student in an Organized Health Care Education/Training Program

## 2023-07-28 DIAGNOSIS — R748 Abnormal levels of other serum enzymes: Secondary | ICD-10-CM

## 2023-07-28 LAB — HEPATITIS A ANTIBODY, TOTAL: Hepatitis A AB,Total: NONREACTIVE

## 2023-07-28 LAB — HEPATITIS B SURFACE ANTIBODY,QUALITATIVE: Hep B S Ab: NONREACTIVE

## 2023-07-28 LAB — IRON,TIBC AND FERRITIN PANEL
%SAT: 17 % (ref 16–45)
Ferritin: 59 ng/mL (ref 16–232)
Iron: 64 ug/dL (ref 45–160)
TIBC: 381 ug/dL (ref 250–450)

## 2023-07-28 LAB — CERULOPLASMIN: Ceruloplasmin: 19 mg/dL (ref 14–48)

## 2023-07-28 LAB — ANTI-NUCLEAR AB-TITER (ANA TITER): ANA Titer 1: 1:80 {titer} — ABNORMAL HIGH

## 2023-07-28 LAB — HIV ANTIBODY (ROUTINE TESTING W REFLEX): HIV 1&2 Ab, 4th Generation: NONREACTIVE

## 2023-07-28 LAB — ANTI-SMOOTH MUSCLE ANTIBODY, IGG: Actin (Smooth Muscle) Antibody (IGG): <20 U (ref <20)

## 2023-07-28 LAB — HEPATITIS B SURFACE ANTIGEN: Hepatitis B Surface Ag: NONREACTIVE

## 2023-07-28 LAB — HEPATITIS B CORE ANTIBODY, TOTAL: Hep B Core Total Ab: NONREACTIVE

## 2023-07-28 LAB — HEPATITIS C ANTIBODY: Hepatitis C Ab: NONREACTIVE

## 2023-07-28 LAB — ANA: Anti Nuclear Antibody (ANA): POSITIVE — AB

## 2023-07-29 ENCOUNTER — Encounter: Payer: Self-pay | Admitting: Student in an Organized Health Care Education/Training Program

## 2023-07-29 DIAGNOSIS — E119 Type 2 diabetes mellitus without complications: Secondary | ICD-10-CM | POA: Insufficient documentation

## 2023-07-29 MED ORDER — OZEMPIC (0.25 OR 0.5 MG/DOSE) 2 MG/3ML ~~LOC~~ SOPN: 0.2500 mg | PEN_INJECTOR | SUBCUTANEOUS | 1 refills | Status: DC

## 2023-07-29 NOTE — Assessment & Plan Note (Signed)
 New diagnosis.  History of prediabetes.  A1c now 6.8%.  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  Because of that, I am going to start treatment with a GLP-1 agonist as this has better control of metabolic liver disease than metformin would.  Will start with Ozempic.  Patient has no contraindications to GLP-1 agonists.

## 2023-07-29 NOTE — Addendum Note (Signed)
 Addended by: Juel Nutley T on: 07/29/2023 11:16 AM   Modules accepted: Orders

## 2023-08-04 ENCOUNTER — Telehealth: Payer: Self-pay

## 2023-08-04 ENCOUNTER — Other Ambulatory Visit (HOSPITAL_COMMUNITY): Payer: Self-pay

## 2023-08-04 NOTE — Telephone Encounter (Signed)
 Pharmacy Patient Advocate Encounter   Received notification from RX Request Messages that prior authorization for Ozempic (0.25 or 0.5 MG/DOSE) 2MG /3ML pen-injectors is required/requested.   Insurance verification completed.   The patient is insured through [employer] .   Per insurance:  The drug you are requesting prior authorization for is not covered. Please select an alternative drug from the choices provided and send in a new prescription for that drug.

## 2023-08-11 NOTE — Telephone Encounter (Signed)
 Additional information has been requested from the patient's insurance in order to proceed with the prior authorization request. Requested information has been sent, or form has been filled out and faxed back to [phone]

## 2023-08-12 ENCOUNTER — Other Ambulatory Visit (HOSPITAL_COMMUNITY): Payer: Self-pay

## 2023-08-17 NOTE — Telephone Encounter (Signed)
 Copied from CRM [id number]. Topic: Clinical - Medication Question >> Aug 17, 2023  8:06 AM Alyse July wrote: Reason for CRM: Patient is having issues getting Ozempic  due to insurance and would like to know if there is an alternative medication. Patient would like a call back to further discuss CB# [phone].

## 2023-08-17 NOTE — Telephone Encounter (Signed)
 Resubmitted PA with key [id number]

## 2023-08-18 ENCOUNTER — Encounter: Payer: Self-pay | Admitting: Student in an Organized Health Care Education/Training Program

## 2023-08-18 DIAGNOSIS — E119 Type 2 diabetes mellitus without complications: Secondary | ICD-10-CM

## 2023-08-18 NOTE — Telephone Encounter (Signed)
 Patient is having trouble with ozempic  and is looking to have something else prescribed so she can start medications before her follow up appointment next month?

## 2023-08-19 ENCOUNTER — Other Ambulatory Visit (HOSPITAL_COMMUNITY): Payer: Self-pay

## 2023-08-19 MED ORDER — TRULICITY 0.75 MG/0.5ML ~~LOC~~ SOAJ: 0.7500 mg | SUBCUTANEOUS | 1 refills | Status: DC

## 2023-08-20 ENCOUNTER — Telehealth: Payer: Self-pay

## 2023-08-20 ENCOUNTER — Other Ambulatory Visit (HOSPITAL_COMMUNITY): Payer: Self-pay

## 2023-08-20 NOTE — Telephone Encounter (Signed)
 Pharmacy Patient Advocate Encounter   Received notification from Pt Calls Messages that prior authorization for Trulicity  0.75MG /0.5ML auto-injectors is required/requested.   Insurance verification completed.   The patient is insured through [employer] .   Per test claim: PA required; PA submitted to above mentioned insurance via CoverMyMeds Key/confirmation #/EOC [id number] Status is pending

## 2023-08-23 ENCOUNTER — Other Ambulatory Visit: Payer: Self-pay

## 2023-08-23 ENCOUNTER — Other Ambulatory Visit (HOSPITAL_COMMUNITY): Payer: Self-pay

## 2023-08-23 DIAGNOSIS — E119 Type 2 diabetes mellitus without complications: Secondary | ICD-10-CM

## 2023-08-23 MED ORDER — TRULICITY 0.75 MG/0.5ML ~~LOC~~ SOAJ: 0.7500 mg | SUBCUTANEOUS | 1 refills | Status: DC

## 2023-08-23 NOTE — Telephone Encounter (Signed)
 Pt called back and was informed May have to have this sent to [facility] due to preferred

## 2023-08-23 NOTE — Telephone Encounter (Signed)
 Please notify pt that Ozempic  was approved and should be available for pick up at the pharmacy

## 2023-08-23 NOTE — Telephone Encounter (Signed)
 Left vm for pt to call office

## 2023-08-23 NOTE — Telephone Encounter (Signed)
 Pt has been notified and we [facility] to [facility] per insurance for better coverage.

## 2023-08-23 NOTE — Telephone Encounter (Signed)
 Pharmacy Patient Advocate Encounter  Received notification from [employer] that Prior Authorization for Ozempic  (0.25 or 0.5 MG/DOSE) 2MG /3ML pen-injectors has been APPROVED from 08/17/23 to 08/21/24. Ran test claim, Copay is $24.99. This test claim was processed through [facility]- copay amounts may vary at other pharmacies due to pharmacy/plan contracts, or as the patient moves through the different stages of their insurance plan.   PA #/Case ID/Reference #: [id number]

## 2023-08-23 NOTE — Telephone Encounter (Signed)
 Pharmacy Patient Advocate Encounter  Received notification from [employer] that Prior Authorization for Trulicity  0.75MG /0.5ML auto-injectors has been DENIED.  Full denial letter will be uploaded to the media tab. See denial reason below.   PA #/Case ID/Reference #:[id number]

## 2023-08-24 ENCOUNTER — Other Ambulatory Visit: Payer: Self-pay | Admitting: Student in an Organized Health Care Education/Training Program

## 2023-08-24 ENCOUNTER — Telehealth: Payer: Self-pay | Admitting: Student in an Organized Health Care Education/Training Program

## 2023-08-24 DIAGNOSIS — E119 Type 2 diabetes mellitus without complications: Secondary | ICD-10-CM

## 2023-08-24 NOTE — Telephone Encounter (Unsigned)
 Copied from CRM [id number]. Topic: Clinical - Medication Refill >> Aug 24, 2023 11:53 AM Clyde Darling P wrote: Medication: Semaglutide ,0.25 or 0.5MG /DOS, (OZEMPIC , 0.25 OR 0.5 MG/DOSE,) 2 MG/3ML SOPN       Has the patient contacted their pharmacy? Yes (Agent: If no, request that the patient contact the pharmacy for the refill. If patient does not wish to contact the pharmacy document the reason why and proceed with request.) (Agent: If yes, when and what did the pharmacy advise?)  This is the patient's preferred pharmacy:   [id number] [place], [place] - [address] AT [facility] [address] [place] [address] Phone: [phone] Fax: [phone]  Is this the correct pharmacy for this prescription? Yes If no, delete pharmacy and type the correct one.   Has the prescription been filled recently? No  Is the patient out of the medication? Yes  Has the patient been seen for an appointment in the last year OR does the patient have an upcoming appointment? Yes  Can we respond through MyChart? Yes  Agent: Please be advised that Rx refills may take up to 3 business days. We ask that you follow-up with your pharmacy.

## 2023-08-24 NOTE — Telephone Encounter (Signed)
 Pt is requesting Ozempic  which is not on her medication list.   Provider to review.

## 2023-08-25 MED ORDER — OZEMPIC (0.25 OR 0.5 MG/DOSE) 2 MG/3ML ~~LOC~~ SOPN: 0.2500 mg | PEN_INJECTOR | SUBCUTANEOUS | 1 refills | Status: DC

## 2023-08-25 NOTE — Telephone Encounter (Signed)
 NOT [facility] PATIENT

## 2023-09-22 ENCOUNTER — Encounter: Payer: Self-pay | Admitting: Student in an Organized Health Care Education/Training Program

## 2023-09-22 ENCOUNTER — Ambulatory Visit: Admitting: Student in an Organized Health Care Education/Training Program

## 2023-09-22 VITALS — BP 127/83 | HR 71 | Ht 66.0 in | Wt 220.0 lb

## 2023-09-22 DIAGNOSIS — R748 Abnormal levels of other serum enzymes: Secondary | ICD-10-CM | POA: Diagnosis not present

## 2023-09-22 DIAGNOSIS — Z7985 Long-term (current) use of injectable non-insulin antidiabetic drugs: Secondary | ICD-10-CM

## 2023-09-22 DIAGNOSIS — E119 Type 2 diabetes mellitus without complications: Secondary | ICD-10-CM

## 2023-09-22 DIAGNOSIS — E66812 Obesity, class 2: Secondary | ICD-10-CM

## 2023-09-22 NOTE — Patient Instructions (Signed)
  VISIT SUMMARY: You came in today for a follow-up on your diabetes treatment with Ozempic . You have experienced some nausea with the medication, but adjusting the timing of your dose has helped. You have lost weight and are working towards your goal of 175 pounds. We also discussed your liver health, rheumatoid arthritis, and blood pressure.  YOUR PLAN: -TYPE 2 DIABETES MELLITUS: Type 2 diabetes is a condition where your body does not use insulin properly, leading to high blood sugar levels. You are currently taking Ozempic  at 0.25 mg, which has helped with weight loss and appetite control. We will reassess your A1c and symptoms in two months. Please monitor any changes in your insurance coverage as it may affect your treatment plan.  -LIVER ENZYME ELEVATION: Elevated liver enzymes can indicate liver inflammation. Your previous tests showed no fibrosis or cirrhosis, and weight loss from Ozempic  is expected to help improve your liver function. We will monitor your liver enzymes through your rheumatologist's blood work.  -RHEUMATOID ARTHRITIS: Rheumatoid arthritis is an autoimmune condition that causes joint inflammation. You are taking Earma and have a follow-up with your rheumatologist in August for blood work and in November for a check-up. We will coordinate with your rheumatologist to get your liver enzyme results.  -HYPERTENSION: Hypertension is high blood pressure. Your blood pressure has improved to 127/83 mmHg, likely due to your weight loss, indicating good control.  -GENERAL HEALTH MAINTENANCE: You are making lifestyle changes to lose weight and improve your overall health. Continue with your dietary management and physical activity to reach your goal weight of 175 pounds.  INSTRUCTIONS: Please continue taking Ozempic  at 0.25 mg and monitor for any changes in your insurance coverage. We will reassess your A1c and symptoms in two months. Ensure you follow up with your rheumatologist in  August for blood work and in November for a check-up. Keep up with your dietary management and physical activity to support your weight loss goals.

## 2023-09-22 NOTE — Progress Notes (Signed)
 Established Patient Office Visit  Subjective   Patient ID: Maria Watson, female    DOB: 08/19/1965  Age: 58 y.o. MRN: [id number]  No chief complaint on file.   HPI  Discussed the use of AI scribe software for clinical note transcription with the patient, who gave verbal consent to proceed.  History of Present Illness Maria Watson is a 58 year old female with diabetes who presents for follow-up on Ozempic  treatment.  She has been managing her diabetes with Ozempic , which she started recently. After her second dose, she experienced significant nausea, leaving her bedridden for a day and unable to keep food down. The nausea persisted, though less severe, with subsequent doses. She adjusted the timing of her fourth dose to the evening, which reduced the nausea but resulted in a headache. She is currently on a 0.25 mg dose.  She has experienced weight loss since starting Ozempic , with her weight decreasing from 228 pounds in May to 220 pounds currently. She feels fuller faster and has reduced her meal portions and snacking. Her goal is to reach a weight of 175 pounds. She has been trying to increase her physical activity by walking more, although she finds it challenging due to the heat and humidity.  Her current medications include Ozempic  and Xeljanz for rheumatoid arthritis. She has no shortness of breath, chest pain, or new lumps or bumps. She has a history of liver issues and previously underwent liver elastography; she recalls being told there were no signs of fibrosis or cirrhosis. She is scheduled for follow-up blood work with her rheumatologist in August.  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      Objective:     BP 127/83 (BP Location: Right Arm, Cuff Size: Normal)   Pulse 71   Ht 5' 6 (1.676 m)   Wt 220 lb (99.8 kg)   BMI 35.51 kg/m    Physical Exam  Gen: Well-appearing Neck: Normal thyroid , no  nodules Heart: Regular, no murmur Lungs: Unlabored, clear Ext: Warm, no edema, normal joints    Assessment & Plan:    Problem List Items Addressed This Visit       High   Elevated liver enzymes - Primary (Chronic)   Elevated liver enzymes with no fibrosis or cirrhosis on ultrasound elastography. Early-stage liver inflammation with good reversibility.  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  It is also possible the Xeljanz medication may be contributing.  I am hopeful that the elevated liver enzymes and hepatocellular inflammation will improve as we get more weight loss.  She is planning for a recheck of liver enzymes with her rheumatology office in about 1 more month.  I asked her to send us  those records when she has them.      Diabetes mellitus without complication (HCC) (Chronic)   On Ozempic  0.25 mg with initial nausea resolved by nighttime dosing. A1c at 6.8% in May, too early for recheck. Weight loss and reduced appetite noted. Prefers to continue Ozempic  despite potential insurance changes.  She has had 8 pounds weight loss over 4 weeks since starting Ozempic .  She is having too much nausea side effect to increase the dose at this time.  Seems to be quite sensitive to the medicine, but motivated to continue.  Hopefully the nausea side effect will improve over time. - Continue Ozempic  at 0.25 mg. - Reassess in two months for A1c  and symptoms.        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  Weight today 220 pounds, down 8 pounds since our last visit.  BMI 35.  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  This is improving since initiating Ozempic .  She is doing a good job with nutrition changes.  She is exercising more.  Will continue to follow her every 2-3 months for weight rechecks.       Return in about 2 months (around 11/23/2023) for diabetes management.     Cleatus Debby Specking, MD

## 2023-09-22 NOTE — Assessment & Plan Note (Signed)
 Chronic and improving.  Weight today 220 pounds, down 8 pounds since our last visit.  BMI 35.  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  This is improving since initiating Ozempic .  She is doing a good job with nutrition changes.  She is exercising more.  Will continue to follow her every 2-3 months for weight rechecks.

## 2023-09-22 NOTE — Assessment & Plan Note (Signed)
 On Ozempic  0.25 mg with initial nausea resolved by nighttime dosing. A1c at 6.8% in May, too early for recheck. Weight loss and reduced appetite noted. Prefers to continue Ozempic  despite potential insurance changes.  She has had 8 pounds weight loss over 4 weeks since starting Ozempic .  She is having too much nausea side effect to increase the dose at this time.  Seems to be quite sensitive to the medicine, but motivated to continue.  Hopefully the nausea side effect will improve over time. - Continue Ozempic  at 0.25 mg. - Reassess in two months for A1c and symptoms.

## 2023-09-22 NOTE — Assessment & Plan Note (Signed)
 Elevated liver enzymes with no fibrosis or cirrhosis on ultrasound elastography. Early-stage liver inflammation with good reversibility.  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  It is also possible the Xeljanz medication may be contributing.  I am hopeful that the elevated liver enzymes and hepatocellular inflammation will improve as we get more weight loss.  She is planning for a recheck of liver enzymes with her rheumatology office in about 1 more month.  I asked her to send us  those records when she has them.

## 2023-10-16 ENCOUNTER — Other Ambulatory Visit: Payer: Self-pay | Admitting: Student in an Organized Health Care Education/Training Program

## 2023-10-16 DIAGNOSIS — E119 Type 2 diabetes mellitus without complications: Secondary | ICD-10-CM

## 2023-10-18 ENCOUNTER — Encounter: Payer: Self-pay | Admitting: Student in an Organized Health Care Education/Training Program

## 2023-10-18 DIAGNOSIS — E119 Type 2 diabetes mellitus without complications: Secondary | ICD-10-CM

## 2023-10-19 MED ORDER — OZEMPIC (0.25 OR 0.5 MG/DOSE) 2 MG/3ML ~~LOC~~ SOPN
0.2500 mg | PEN_INJECTOR | SUBCUTANEOUS | 1 refills | Status: DC
Start: 2023-10-19 — End: 2023-11-23

## 2023-10-19 NOTE — Addendum Note (Signed)
 Addended by: Reatha Sur E on: 10/19/2023 07:58 AM   Modules accepted: Orders

## 2023-11-23 ENCOUNTER — Ambulatory Visit: Admitting: Student in an Organized Health Care Education/Training Program

## 2023-11-23 ENCOUNTER — Encounter: Payer: Self-pay | Admitting: Student in an Organized Health Care Education/Training Program

## 2023-11-23 VITALS — BP 136/84 | HR 76 | Wt 207.0 lb

## 2023-11-23 DIAGNOSIS — E66812 Obesity, class 2: Secondary | ICD-10-CM | POA: Diagnosis not present

## 2023-11-23 DIAGNOSIS — Z1231 Encounter for screening mammogram for malignant neoplasm of breast: Secondary | ICD-10-CM

## 2023-11-23 DIAGNOSIS — I152 Hypertension secondary to endocrine disorders: Secondary | ICD-10-CM

## 2023-11-23 DIAGNOSIS — E7800 Pure hypercholesterolemia, unspecified: Secondary | ICD-10-CM | POA: Diagnosis not present

## 2023-11-23 DIAGNOSIS — E1159 Type 2 diabetes mellitus with other circulatory complications: Secondary | ICD-10-CM | POA: Diagnosis not present

## 2023-11-23 DIAGNOSIS — E119 Type 2 diabetes mellitus without complications: Secondary | ICD-10-CM | POA: Diagnosis not present

## 2023-11-23 LAB — MICROALBUMIN / CREATININE URINE RATIO
Creatinine,U: 162.5 mg/dL
Microalb Creat Ratio: 11.4 mg/g (ref 0.0–30.0)
Microalb, Ur: 1.8 mg/dL (ref 0.0–1.9)

## 2023-11-23 LAB — POCT GLYCOSYLATED HEMOGLOBIN (HGB A1C): Hemoglobin A1C: 5.1 % (ref 4.0–5.6)

## 2023-11-23 MED ORDER — OZEMPIC (0.25 OR 0.5 MG/DOSE) 2 MG/3ML ~~LOC~~ SOPN: 0.5000 mg | PEN_INJECTOR | SUBCUTANEOUS | 2 refills | Status: DC

## 2023-11-23 MED ORDER — ROSUVASTATIN CALCIUM 10 MG PO TABS: 10.0000 mg | ORAL_TABLET | Freq: Every day | ORAL | 3 refills | Status: AC

## 2023-11-23 NOTE — Assessment & Plan Note (Signed)
 Blood pressure readings are 143/91 and 136/84, with stress possibly contributing.  He was normal at her last visit.  We talked about lifestyle modifications to start.  Going to a lot of stress right now so I think she has borderline stage I hypertension.  Monitor blood pressure and reassess in three months. Consider starting losartan if hypertension persists.  Check urine microalbumin today.

## 2023-11-23 NOTE — Assessment & Plan Note (Signed)
 The 10-year ASCVD risk score (Arnett DK, et al., 2019) is: 8.1%  LDL is elevated at 132 mg/dL, increasing cardiovascular risk. Start Crestor  10 mg once daily and monitor for muscle soreness.

## 2023-11-23 NOTE — Progress Notes (Addendum)
 Established Patient Office Visit  Subjective   Patient ID: Maria Watson, female    DOB: [birth year]  Age: 58 y.o. MRN: [id number]  Chief Complaint  Patient presents with   Diabetes    2 month follow up   Patient declined all health maintenance     HPI  Discussed the use of AI scribe software for clinical note transcription with the patient, who gave verbal consent to proceed.  History of Present Illness Maria Watson is a 58 year old female with diabetes and rheumatoid arthritis who presents for follow-up and medication management.  She has experienced a weight loss of 13 pounds over the past two months, currently weighing 207 pounds, down from 220 pounds in July. This weight loss occurred despite stress eating. She is using Ozempic  at a dose of 0.25 mg, which initially caused significant illness, but she has since tolerated it well. She has been on this dose for two to three months.  She is concerned about losing her insurance coverage due to job loss, which may affect her access to medications like Ozempic  and Xeljanz. She has been actively seeking new employment, sending out 60 resumes in 60 days, and has had three interviews, including two with [employer]. Her mood has been affected by the stress of job loss, but she describes her family as supportive, with her husband working evenings and her daughter offering emotional support. She has worked remotely since 2018 and is open to various job opportunities, including hybrid positions.  She has rheumatoid arthritis and is currently using Xeljanz, with two bottles on hand. She has resumed exercise after a brief hiatus, which has improved her well-being.  Her diabetes was diagnosed in May, with an initial A1c of 6.8%. Since starting Ozempic  and losing weight, her A1c has decreased to 5.1%. She has not used cholesterol medications in the past, despite having elevated cholesterol levels, with an LDL of 132 in May.  Her blood pressure  today is 143/91, which is higher than previous readings of 127/83 and 136/84. She attributes this to stress and excitement from her daughter's volleyball game. No other medical concerns have been reported.      Objective:     BP 136/84   Pulse 76   Wt 207 lb (93.9 kg)   SpO2 100%   BMI 33.41 kg/m   Physical Exam  Gen: Well-appearing Neck: Normal thyroid , no nodules or adenopathy Heart: Regular, no murmur Lungs: Unlabored, clear throughout Ext: Warm, no edema   Results for orders placed or performed in visit on 11/23/23  POCT glycosylated hemoglobin (Hb A1C)  Result Value Ref Range   Hemoglobin A1C 5.1 4.0 - 5.6 %   HbA1c POC (<> result, manual entry)     HbA1c, POC (prediabetic range)     HbA1c, POC (controlled diabetic range)        Assessment & Plan:   Problem List Items Addressed This Visit       High   Diabetes mellitus without complication (HCC) - Primary (Chronic)   Diabetes is well-controlled with an A1c decreased to 5.1%. Increase Ozempic  to 0.5 mg and monitor for tolerance to the increased dose.  Check urine microalbumin today.  May need to offer a statin in the future.  Obtain recent blood work from rheumatologist office that has the most recent GFR estimate.      Relevant Medications   Semaglutide ,0.25 or 0.5MG /DOS, (OZEMPIC , 0.25 OR 0.5 MG/DOSE,) 2 MG/3ML SOPN   rosuvastatin  (CRESTOR ) 10 MG  tablet   Other Relevant Orders   POCT glycosylated hemoglobin (Hb A1C) (Completed)   Microalbumin / creatinine urine ratio   Hypertension associated with diabetes (HCC) (Chronic)   Blood pressure readings are 143/91 and 136/84, with stress possibly contributing.  He was normal at her last visit.  We talked about lifestyle modifications to start.  Going to a lot of stress right now so I think she has borderline stage I hypertension.  Monitor blood pressure and reassess in three months. Consider starting losartan if hypertension persists.  Check urine microalbumin  today.      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  Weight today 207 pounds, BMI of 33. She has lost 13 pounds over two months, with a current weight of 207 pounds and a goal of 175 pounds. Continue current weight loss efforts and increase Ozempic  to 0.5 mg to aid further weight loss.      Hyperlipidemia (Chronic)   The 10-year ASCVD risk score (Arnett DK, et al., 2019) is: 8.1%  LDL is elevated at 132 mg/dL, increasing cardiovascular risk. Start Crestor  10 mg once daily and monitor for muscle soreness.        Relevant Medications   rosuvastatin  (CRESTOR ) 10 MG tablet   Other Visit Diagnoses       Screening mammogram for breast cancer       Relevant Orders   MM DIGITAL SCREENING BILATERAL       Return in about 3 months (around 02/22/2024).    Maria Debby Specking, MD

## 2023-11-23 NOTE — Patient Instructions (Signed)
  VISIT SUMMARY: Today, we discussed your diabetes, weight loss, cholesterol, blood pressure, and rheumatoid arthritis. You have made significant progress with your diabetes and weight loss, and we have adjusted your medications to support your continued health. We also addressed your concerns about insurance coverage and job loss stress.  YOUR PLAN: -TYPE 2 DIABETES MELLITUS: Type 2 diabetes is a condition where your body does not use insulin properly, leading to high blood sugar levels. Your diabetes is well-controlled with your A1c now at 5.1%. We will check your A1c today and increase your Ozempic  dose to 0.5 mg. Please monitor how you feel with the increased dose.  -OBESITY, BMI 35-39.9: Obesity is a condition where excess body fat affects your health. You have lost 13 pounds over the past two months, and your current weight is 207 pounds. We aim for a goal weight of 175 pounds. Continue your current weight loss efforts and increase your Ozempic  dose to 0.5 mg to help with further weight loss.  -PURE HYPERCHOLESTEROLEMIA: Hypercholesterolemia means having high levels of cholesterol in your blood, which can increase your risk of heart disease. Your LDL cholesterol is elevated at 132 mg/dL. We will start you on Crestor  10 mg once daily and monitor for any muscle soreness.  -STAGE 1 HYPERTENSION: Hypertension, or high blood pressure, can increase your risk of heart disease and stroke. Your recent blood pressure readings are 143/91 and 136/84, likely due to stress. We will monitor your blood pressure and reassess in three months. If it remains high, we may start you on losartan.  -RHEUMATOID ARTHRITIS: Rheumatoid arthritis is an autoimmune condition that causes joint inflammation and pain. You are managing it with Earma and have no new symptoms. We have discussed your insurance coverage to ensure you can continue accessing Xeljanz.  INSTRUCTIONS: Please follow up in three months to reassess your  blood pressure and overall health. Continue monitoring your blood sugar levels and how you feel with the increased Ozempic  dose. Start taking Crestor  10 mg once daily and watch for any muscle soreness. If you experience any new symptoms or have concerns, contact our office.

## 2023-11-23 NOTE — Assessment & Plan Note (Signed)
 Diabetes is well-controlled with an A1c decreased to 5.1%. Increase Ozempic  to 0.5 mg and monitor for tolerance to the increased dose.  Check urine microalbumin today.  May need to offer a statin in the future.  Obtain recent blood work from rheumatologist office that has the most recent GFR estimate.

## 2023-11-23 NOTE — Assessment & Plan Note (Signed)
 Improving.  Weight today 207 pounds, BMI of 33. She has lost 13 pounds over two months, with a current weight of 207 pounds and a goal of 175 pounds. Continue current weight loss efforts and increase Ozempic  to 0.5 mg to aid further weight loss.

## 2024-02-02 ENCOUNTER — Other Ambulatory Visit (HOSPITAL_COMMUNITY): Payer: Self-pay

## 2024-02-02 ENCOUNTER — Telehealth: Payer: Self-pay

## 2024-02-02 NOTE — Telephone Encounter (Signed)
 Pharmacy Patient Advocate Encounter   Received notification from [employer]base that prior authorization for Ozempic  (0.25 or 0.5 MG/DOSE) 2MG /3ML pen-injectors is required/requested.   Insurance verification completed.   The patient is insured through [employer] .   Per test claim: PA required; PA submitted to above mentioned insurance via Latent Key/confirmation #/EOC [id number] Status is pending

## 2024-02-02 NOTE — Telephone Encounter (Signed)
 Pharmacy Patient Advocate Encounter  Received notification from [facility]  that Prior Authorization for Ozempic  Pen injectors has been APPROVED from 02/02/24 to 02/01/25   PA #/Case ID/Reference #: [id number]

## 2024-02-02 NOTE — Telephone Encounter (Signed)
 Called to inform her of the approval, no answer, LM to call back

## 2024-02-07 NOTE — Telephone Encounter (Signed)
 Patient is aware. Will pick today.

## 2024-02-22 ENCOUNTER — Ambulatory Visit: Admitting: Student in an Organized Health Care Education/Training Program

## 2024-02-22 ENCOUNTER — Encounter: Payer: Self-pay | Admitting: Student in an Organized Health Care Education/Training Program

## 2024-02-22 VITALS — BP 129/89 | HR 76 | Wt 209.0 lb

## 2024-02-22 DIAGNOSIS — Z7985 Long-term (current) use of injectable non-insulin antidiabetic drugs: Secondary | ICD-10-CM | POA: Diagnosis not present

## 2024-02-22 DIAGNOSIS — E66812 Obesity, class 2: Secondary | ICD-10-CM

## 2024-02-22 DIAGNOSIS — E119 Type 2 diabetes mellitus without complications: Secondary | ICD-10-CM

## 2024-02-22 DIAGNOSIS — Z6833 Body mass index (BMI) 33.0-33.9, adult: Secondary | ICD-10-CM

## 2024-02-22 LAB — POCT GLYCOSYLATED HEMOGLOBIN (HGB A1C): Hemoglobin A1C: 5.4 % (ref 4.0–5.6)

## 2024-02-22 LAB — MICROALBUMIN / CREATININE URINE RATIO
Creatinine,U: 17.8 mg/dL
Microalb Creat Ratio: UNDETERMINED mg/g (ref 0.0–30.0)
Microalb, Ur: <0.7 mg/dL

## 2024-02-22 NOTE — Assessment & Plan Note (Signed)
 Chronic and stable.  Weight today 209 pounds with a BMI of 33.  Not much weight loss compared to 3 months ago.  Plan to increase Ozempic  to 0.5 mg weekly.  Recheck weight in 3 months.  Continue with lifestyle modifications.

## 2024-02-22 NOTE — Progress Notes (Signed)
 Established Patient Office Visit  Patient ID: Maria Watson, female    DOB: 11-12-1965  Age: 58 y.o. MRN: [id number] PCP: Jerrell Cleatus Ned, MD  Chief Complaint  Patient presents with   Diabetes    3 month follow up   Patient declined health maintenance other than her eye exam which I have requested records for     Subjective:     HPI  Discussed the use of AI scribe software for clinical note transcription with the patient, who gave verbal consent to proceed.  History of Present Illness Maria Watson is a 58 year old female with diabetes and rheumatoid arthritis who presents for a follow-up visit.  Her mood has improved since her last visit three months ago. She started a new job on November 17th, working full-time from home as a Engineer, Site, focusing on anesthesia coding. While unemployed, she exercised regularly by walking a trail daily, but her physical activity has decreased since starting her new job due to time constraints and poor weather.  She is currently taking Ozempic  for diabetes management, with a dose of 0.25 mg taken on Thursday nights. She wants to increase the dose to 0.5 mg, as she is not experiencing nausea or vomiting. Her recent A1c was checked and is at 5.4%. She wants to control her sweet tooth, especially during the holiday season.  She is also on Crestor  for cholesterol management and Xeljanz for rheumatoid arthritis. Her arthritis is being managed with Earma, and she recently saw her rheumatologist, who conducted blood work. She recently saw her rheumatologist, who conducted blood work, and she is awaiting those results.  She received a flu shot, as required by her employer, despite working remotely. She mentions a tickle in her throat but does not elaborate further. Her sleep is generally good, averaging seven to eight hours per night, although she sometimes falls short of eight hours.     Objective:     BP 129/89    Pulse 76   Wt 209 lb (94.8 kg)   BMI 33.73 kg/m    Physical Exam  Gen: Well-appearing woman Ears: Clear ear canals, normal tympanic membranes Mouth: No oral lesions Neck: Normal thyroid , no nodules or adenopathy Heart: Regular, no murmur Lungs: Unlabored, clear throughout Ext: Warm, no edema, normal joints   Results for orders placed or performed in visit on 02/22/24  POCT glycosylated hemoglobin (Hb A1C)  Result Value Ref Range   Hemoglobin A1C 5.4 4.0 - 5.6 %   HbA1c POC (<> result, manual entry)     HbA1c, POC (prediabetic range)     HbA1c, POC (controlled diabetic range)       Assessment & Plan:   Problem List Items Addressed This Visit       High   Diabetes mellitus without complication (HCC) - Primary (Chronic)   Chronic and stable.  Doing very well on Ozempic .  3 months ago I recommended to increase the dose to 0.5 mg weekly, but that did not happen, she stated 0.25 mg weekly.  I recommended going up on the dose this week which she agrees to.  She is having no nausea or vomiting.  Check urine microalbumin today.      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  Weight today 209 pounds with a BMI  of 33.  Not much weight loss compared to 3 months ago.  Plan to increase Ozempic  to 0.5 mg weekly.  Recheck weight in 3 months.  Continue with lifestyle modifications.       Return in about 3 months (around 05/22/2024) for CPE next visit please.    Cleatus Debby Specking, MD [facility]

## 2024-02-22 NOTE — Assessment & Plan Note (Signed)
 Chronic and stable.  Doing very well on Ozempic .  3 months ago I recommended to increase the dose to 0.5 mg weekly, but that did not happen, she stated 0.25 mg weekly.  I recommended going up on the dose this week which she agrees to.  She is having no nausea or vomiting.  Check urine microalbumin today.

## 2024-02-22 NOTE — Patient Instructions (Signed)
°  VISIT SUMMARY: Today, you came in for a follow-up visit to discuss your diabetes, rheumatoid arthritis, and general health. You mentioned that your mood has improved since starting your new job, although your physical activity has decreased. We reviewed your current medications and discussed adjustments to your diabetes management. We also talked about your general health maintenance and upcoming screenings.  YOUR PLAN: -TYPE 2 DIABETES MELLITUS: Type 2 diabetes is a condition where your body does not use insulin properly, leading to high blood sugar levels. Your diabetes is well-controlled with an A1c of 5.4%. We have increased your Ozempic  dose to 0.5 mg weekly to help manage your blood sugar levels. We also checked your urine for protein to monitor your kidney health.  -OBESITY, CLASS II: Obesity is a condition where excess body fat may affect your health. Your weight is stable at 209 lbs, with a goal of reaching 200 lbs. The increased dose of Ozempic  to 0.5 mg weekly will also help with weight management.  -HYPERLIPIDEMIA: Hyperlipidemia is a condition where you have high levels of fats (lipids) in your blood. This is being managed with Crestor , which helps lower your cholesterol levels.  -GENERAL HEALTH MAINTENANCE: You are up to date with your flu vaccination. We discussed the importance of regular screenings for breast, colon, and cervical cancer. Please schedule a general physical exam in three months to discuss these screening options further.  INSTRUCTIONS: Please schedule a general physical exam in three months to discuss screening options for colon, breast, and cervical cancer.

## 2024-02-28 ENCOUNTER — Encounter: Payer: Self-pay | Admitting: Student in an Organized Health Care Education/Training Program

## 2024-02-28 DIAGNOSIS — E119 Type 2 diabetes mellitus without complications: Secondary | ICD-10-CM

## 2024-03-01 MED ORDER — OZEMPIC (0.25 OR 0.5 MG/DOSE) 2 MG/3ML ~~LOC~~ SOPN: 0.5000 mg | PEN_INJECTOR | SUBCUTANEOUS | 2 refills | Status: AC

## 2024-05-23 ENCOUNTER — Encounter: Admitting: Student in an Organized Health Care Education/Training Program
